# Patient Record
Sex: Male | Born: 1965 | Race: White | Hispanic: No | Smoking: Never smoker
Health system: Southern US, Community
[De-identification: ages and names within clinical notes are randomized; demographics above are authoritative.]

## PROBLEM LIST (undated history)

## (undated) HISTORY — PX: BACK SURGERY: SHX140

---

## 1997-12-08 ENCOUNTER — Emergency Department (HOSPITAL_COMMUNITY): Admission: EM | Admit: 1997-12-08 | Discharge: 1997-12-08 | Payer: Self-pay

## 1997-12-10 ENCOUNTER — Emergency Department (HOSPITAL_COMMUNITY): Admission: EM | Admit: 1997-12-10 | Discharge: 1997-12-10 | Payer: Self-pay

## 2001-09-12 ENCOUNTER — Encounter: Payer: Self-pay | Admitting: Orthopedic Surgery

## 2001-09-13 ENCOUNTER — Inpatient Hospital Stay (HOSPITAL_COMMUNITY): Admission: EM | Admit: 2001-09-13 | Discharge: 2001-09-14 | Payer: Self-pay | Admitting: Orthopedic Surgery

## 2004-01-03 ENCOUNTER — Ambulatory Visit (HOSPITAL_COMMUNITY): Admission: RE | Admit: 2004-01-03 | Discharge: 2004-01-03 | Payer: Self-pay | Admitting: Gastroenterology

## 2006-09-01 ENCOUNTER — Encounter: Payer: Self-pay | Admitting: Emergency Medicine

## 2006-09-01 ENCOUNTER — Ambulatory Visit (HOSPITAL_COMMUNITY): Admission: RE | Admit: 2006-09-01 | Discharge: 2006-09-01 | Payer: Self-pay | Admitting: Family Medicine

## 2006-09-06 ENCOUNTER — Ambulatory Visit (HOSPITAL_COMMUNITY): Admission: RE | Admit: 2006-09-06 | Discharge: 2006-09-06 | Payer: Self-pay | Admitting: Urology

## 2010-06-17 NOTE — Op Note (Signed)
NAMEGERAD, CORNELIO             ACCOUNT NO.:  [id number]   MEDICAL RECORD NO.:  [id number]          PATIENT TYPE:  EMS   LOCATION:  MAJO                         FACILITY:  [facility]   PHYSICIAN:  Martina Sinner, MD DATE OF BIRTH:  1965-09-05   DATE OF PROCEDURE:  09/01/2006  DATE OF DISCHARGE:                               OPERATIVE REPORT   PREOPERATIVE DIAGNOSIS:  Right ureteral stone with renal colic.   POSTOPERATIVE DIAGNOSIS:  Right ureteral stone with renal colic.   SURGERY:  Cystoscopy, right retrograde urethrogram, insertion of right  ureteral stent.   Mr. Holman Bonsignore had a renal colic not settling down in the emergency  room.  We decided to perform a cystoscopy, retrograde urethrogram and  stent.   The patient is prepped and draped in the usual fashion.  He was given IV  ciprofloxacin.  He had a little bit of a faint rash on his chest so we  stopped ciprofloxacin and gave him 80 mg IV gentamicin.   A 21-French scope was used for the examination.  The penile bulbar  membranous and prostatic urethra were normal.  Bladder mucosa was  normal.  The ureteral orifices were normal.  There was no stitch,  foreign body or carcinoma.   I initially passed a sensor guide wire fluoroscopically and  cystoscopically into the high pelvic right ureter.  I then placed a  ureteral catheter over, this removing the sensor wire.  I then did a  retrograde urethrogram.  He had no hydroureter.  He did not have a lot  of hydronephrosis.  In my opinion, you could see a filling defect just  below the right ureteral pelvic junction which is in keeping with the  position of the stone on CT scan.   Under fluoroscopic guidance, a sensor wire was then replaced through the  open-ended catheter up into the upper pole calix fluoroscopically.  Dye  stayed in the renal pelvis, allowing me to easily see its position.   I then placed a double-J stent that was 26 cm in length x 7 mm, passed  the  stent in the operative into the renal pelvis and also curling it up  into the bladder.  The wire was removed allowing the stent to lay in  nicely.  We did a hard copy x-rays for documentation.     There was minimal bleeding.  Bladder was emptied at the end of the  case.  No catheter was used.   The patient will be sent home with Vicodin.  He was given gentamicin and  some ciprofloxacin.  I am not going to give him any antibiotics.  I will  see him on Friday and get a KUB.  He likely is a good candidate for  lithotripsy.           ______________________________  Martina Sinner, MD  Electronically Signed    SAM/MEDQ  D:  09/01/2006  T:  09/02/2006  Job:  [id number]

## 2010-06-17 NOTE — Op Note (Signed)
NAMEJAMESEN, STAHNKE             ACCOUNT NO.:  [id number]   MEDICAL RECORD NO.:  [id number]          PATIENT TYPE:  EMS   LOCATION:  MAJO                         FACILITY:  [facility]   PHYSICIAN:  Martina Sinner, MD DATE OF BIRTH:  31-Oct-1965   DATE OF PROCEDURE:  09/01/2006  DATE OF DISCHARGE:                               OPERATIVE REPORT   PREOPERATIVE DIAGNOSIS:  Right ureteral stone with renal colic.   POSTOPERATIVE DIAGNOSIS:  Right ureteral stone with renal colic.   SURGERY:  Right retrograde urethrogram.   Mr. Lobue had a right retrograde urethrogram under anesthesia.  I  initially placed a sensor wire into the high pelvic ureter and then an  open-ended catheter.  I used approximately 6 mL of contrast and injected  the ureter not under pressure.  There is no extravasation.  There was no  hydroureter and minimal hydronephrosis of the right renal pelvis.  I  felt that I could see a filling defect or stone just below the right  ureteropelvic junction.  Fluoroscopy was also done to check the position  of the stent during and at the end of the case.           ______________________________  Martina Sinner, MD  Electronically Signed     SAM/MEDQ  D:  09/01/2006  T:  09/02/2006  Job:  [id number]

## 2010-06-17 NOTE — Op Note (Signed)
NAMEAXTEN, PASCUCCI             ACCOUNT NO.:  [id number]   MEDICAL RECORD NO.:  [id number]          PATIENT TYPE:  AMB   LOCATION:  DAY                          FACILITY:  [facility]   PHYSICIAN:  Martina Sinner, MD DATE OF BIRTH:  May 04, 1965   DATE OF PROCEDURE:  09/01/2006  DATE OF DISCHARGE:  09/01/2006                               OPERATIVE REPORT   PREOPERATIVE DIAGNOSIS:  Right ureteral stone.   POSTOPERATIVE DIAGNOSIS:  Right ureteral stone.   SURGERY:  Cystoscopy, right retrograde ureterogram, and insertion of  right ureteral stent.   Mr. [place] has right renal colic.  For symptomatic relief, he  underwent the above procedure.   The patient was prepped and draped in the usual fashion.  A 21 [place]  scope was used for the examination.  The penile, bulbar, membranous, and  prostatic urethra were normal.  The bladder mucosa and trigone were  normal.  There was no stitch, foreign body, or carcinoma.  There was no  edema of the right ureteral orifice.   Using a glidewire, I was able to easily pass the open ended ureteral  catheter into the distal right ureter.  I did a right retrograde  ureterogram.  He had a filling defect near the right ureteropelvic  junction.  Fluoroscopically, I placed a wire up and beyond the stone in  the right renal pelvis.  He had a small right renal pelvis.  I then  passed a 26 cm x 7 French stent into the right renal pelvis.  Fluoroscopically, it was in good position.  Cystoscopically, the stent  was in good position.  The bladder was emptied and the patient was taken  to the recovery room.   RIGHT RETROGRADE URETHROGRAM:   DIAGNOSIS:  Right ureteral stone.   Under anesthesia, Mr. Skare underwent a right retrograde ureterogram  using a 7-French open end ureteral catheter cystoscopically and  fluoroscopically placed into the right distal ureter.  I injected  approximately 8 mL of contrast.  He had a filling defect of the right  ureteropelvic junction.  He had a normal ureter in caliber.  He did not  have marked hydronephrosis.  He had a small right renal pelvis.  I was  able to easily pass the stent fluoroscopically beyond the stone.  Fluoroscopically, the stent was in good position in the bladder and  renal pelvis.           ______________________________  Martina Sinner, MD  Electronically Signed     SAM/MEDQ  D:  09/28/2006  T:  09/28/2006  Job:  [id number]

## 2010-06-20 NOTE — Op Note (Signed)
Bruce Fitzgerald, RESSLER             ACCOUNT NO.:  [id number]   MEDICAL RECORD NO.:  [id number]          PATIENT TYPE:  AMB   LOCATION:  ENDO                         FACILITY:  [facility]   PHYSICIAN:  Graylin Shiver, M.D.   DATE OF BIRTH:  [birth year]   DATE OF PROCEDURE:  01/03/2004  DATE OF DISCHARGE:                                 OPERATIVE REPORT   PROCEDURE:  Colonoscopy.   INDICATIONS:  The patient complains of frequent bowel movements and states  that after he has a bowel movement, he has the sensation that he has to go  again but cannot.  It was felt this could be secondary to rectal spasm.  He  was tried on Bentyl 20 mg q.i.d., but this has not helped.  Colonoscopy is  being done to look for an abnormality in the colon.   Informed consent was obtained after explanation of the risks of bleeding,  infection, and perforation.   PREMEDICATION:  Fentanyl 60 mcg IV, Versed 8 mg IV.   PROCEDURE:  With the patient in the left lateral decubitus position, a  rectal exam was performed.  No masses were felt, no external lesions were  seen.  The Olympus colonoscope was inserted into the rectum and advanced  around the colon to the cecum.  Cecal landmarks were identified.  The cecum  and ascending colon were normal, the descending colon normal, the transverse  colon normal, the descending colon, sigmoid, and rectum were normal.  The  scope was retroflexed in the rectum.  No abnormalities were seen.  He  tolerated the procedure well without complications.   IMPRESSION:  Normal colonoscopy to the cecum.   I will give the patient a therapeutic trial of Robinul Forte to see if this  helps instead of Bentyl.       SFG/MEDQ  D:  01/03/2004  T:  01/03/2004  Job:  [id number]   cc:   Otilio Connors. Gerri Spore, M.D.  [address]  [place]  [place] [address]  Fax: [phone]

## 2010-06-20 NOTE — Op Note (Signed)
TNAMEKINGSTYN, DERUITER                      ACCOUNT NO.:  [id number]   MEDICAL RECORD NO.:  [id number]                   PATIENT TYPE:  AMB   LOCATION:  DAY                                  FACILITY:  [facility]   PHYSICIAN:  Georges Lynch. Darrelyn Hillock, M.D.             DATE OF BIRTH:  06/25/1965   DATE OF PROCEDURE:  09/12/2001  DATE OF DISCHARGE:                                 OPERATIVE REPORT   SURGEON:  Georges Lynch. Darrelyn Hillock, M.D.   ASSISTANT:  Illene Labrador. [place], M.D.   Fax copy to [employer]. Ethelene Hal, M.D. at [phone].   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   POSTOPERATIVE DIAGNOSIS:  A large, extruded herniated disk at L5-S1 central  and to the left.  Note, he has strictly left lower extremity pain and back  pain.  No right lower extremity pain.   OPERATION:  Hemilaminotomy and microdiskectomy at L5-S1 on the left.   DESCRIPTION OF PROCEDURE:  Under general anesthesia, a routine orthopedic  prep and draping of the lower back was carried out.  The patient was on a  spinal frame.  Two needles were placed in the back for localization  purposes, and x-ray was taken.  Once we had verified the L5-S1 interspace,  an incision was made over the L5-S1 interspace.  Bleeders identified and  cauterized.  Following this, another x-ray was taken to verify our exact  position.  We were definitely at L5-S1.  At this point, a hemilaminotomy was  carried out in the usual fashion.  I then removed the ligamentum flavum with  great care taken not to injure underlying dura.  The dura was protected with  cottonoids.  I gently retracted the dura from the lateral recess region,  cleaned out the lateral recess, and then went down and cauterized the  lateral recess veins with a bipolar.  I then identified a disk.  He had a  large herniated disk at L5-S1.  Cruciate incision was made in the posterior  longitudinal ligament, and disk literally exploded out  through the defect we  created.  I then went with a nerve hook and then the Epstein curettes and  then with the pituitary rongeurs and removed multiple large pieces of disk  material.  We nicely decompressed the S1 nerve root.  We were able to easily  follow the hockey-stick above and below the root as it exited the foramen.  We went in and made multiple passes into the disk space, and we went  subligamentous as well, and there were no otter free fragments noted.  The  dura and nerve root now was quite nicely decompressed.  We thoroughly  irrigated out the area again and then loosely applied some  thrombin-soaked  Gelfoam over the dura.  I then closed the wound in layers in the usual  fashion.  I left the superior part  of the deep part of the wound slightly  open for drainage purposes.  The remaining part of the subcu and skin was  closed in the usual fashion.  Sterile Neosporin dressings applied.  He had 1  g of IV Ancef prior to surgery.                                                Ronald A. Darrelyn Hillock, M.D.   RAG/MEDQ  D:  09/12/2001  T:  09/14/2001  Job:  [id number]   cc:   Caralyn Guile. Ethelene Hal, M.D.

## 2010-11-17 LAB — POCT URINE HEMOGLOBIN
Hgb urine dipstick: POSITIVE — AB
Operator id: 279831

## 2010-11-17 LAB — URINALYSIS, ROUTINE W REFLEX MICROSCOPIC
Bilirubin Urine: NEGATIVE
Glucose, UA: NEGATIVE
Ketones, ur: 15 — AB
Leukocytes, UA: NEGATIVE
Nitrite: NEGATIVE
Protein, ur: 30 — AB
Specific Gravity, Urine: 1.03
Urobilinogen, UA: 0.2
pH: 5.5

## 2010-11-17 LAB — I-STAT 8, (EC8 V) (CONVERTED LAB)
Acid-base deficit: 1
BUN: 19
Bicarbonate: 24.1 — ABNORMAL HIGH
Chloride: 106
Glucose, Bld: 125 — ABNORMAL HIGH
HCT: 45
Hemoglobin: 15.3
Operator id: 279831
Potassium: 4
Sodium: 138
TCO2: 25
pCO2, Ven: 42.7 — ABNORMAL LOW
pH, Ven: 7.359 — ABNORMAL HIGH

## 2010-11-17 LAB — URINE MICROSCOPIC-ADD ON

## 2010-11-17 LAB — POCT I-STAT CREATININE
Creatinine, Ser: 1.2
Operator id: 279831

## 2014-04-06 ENCOUNTER — Emergency Department (HOSPITAL_COMMUNITY)
Admission: EM | Admit: 2014-04-06 | Discharge: 2014-04-06 | Disposition: A | Discharge status: Home / Self Care | Payer: No Typology Code available for payment source | Attending: Emergency Medicine | Admitting: Emergency Medicine

## 2014-04-06 ENCOUNTER — Encounter (HOSPITAL_COMMUNITY): Payer: Self-pay | Admitting: Neurology

## 2014-04-06 ENCOUNTER — Emergency Department (HOSPITAL_COMMUNITY): Payer: No Typology Code available for payment source

## 2014-04-06 DIAGNOSIS — R29898 Other symptoms and signs involving the musculoskeletal system: Secondary | ICD-10-CM

## 2014-04-06 DIAGNOSIS — S3992XA Unspecified injury of lower back, initial encounter: Secondary | ICD-10-CM | POA: Insufficient documentation

## 2014-04-06 DIAGNOSIS — Y998 Other external cause status: Secondary | ICD-10-CM | POA: Diagnosis not present

## 2014-04-06 DIAGNOSIS — M549 Dorsalgia, unspecified: Secondary | ICD-10-CM

## 2014-04-06 DIAGNOSIS — Y92410 Unspecified street and highway as the place of occurrence of the external cause: Secondary | ICD-10-CM | POA: Diagnosis not present

## 2014-04-06 DIAGNOSIS — M542 Cervicalgia: Secondary | ICD-10-CM

## 2014-04-06 DIAGNOSIS — S299XXA Unspecified injury of thorax, initial encounter: Secondary | ICD-10-CM | POA: Insufficient documentation

## 2014-04-06 DIAGNOSIS — Y9389 Activity, other specified: Secondary | ICD-10-CM | POA: Diagnosis not present

## 2014-04-06 DIAGNOSIS — Z9889 Other specified postprocedural states: Secondary | ICD-10-CM | POA: Insufficient documentation

## 2014-04-06 DIAGNOSIS — M545 Low back pain: Secondary | ICD-10-CM

## 2014-04-06 DIAGNOSIS — S199XXA Unspecified injury of neck, initial encounter: Secondary | ICD-10-CM | POA: Diagnosis present

## 2014-04-06 DIAGNOSIS — S79912A Unspecified injury of left hip, initial encounter: Secondary | ICD-10-CM | POA: Diagnosis not present

## 2014-04-06 DIAGNOSIS — M25559 Pain in unspecified hip: Secondary | ICD-10-CM

## 2014-04-06 MED ORDER — METHOCARBAMOL 500 MG PO TABS: 500.0000 mg | ORAL_TABLET | Freq: Two times a day (BID) | ORAL | Status: AC

## 2014-04-06 MED ORDER — KETOROLAC TROMETHAMINE 60 MG/2ML IM SOLN
60.0000 mg | Freq: Once | INTRAMUSCULAR | Status: AC
  Administered 2014-04-06: 60 mg via INTRAMUSCULAR
  Filled 2014-04-06: qty 2
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
## 2014-04-06 NOTE — ED Notes (Signed)
Patient alert and oriented at discharge.  Patient taken to the waiting room via wheelchair by EMT.
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
## 2018-05-11 ENCOUNTER — Other Ambulatory Visit: Payer: Self-pay

## 2018-05-11 ENCOUNTER — Ambulatory Visit: Payer: Self-pay | Admitting: Physician Assistant

## 2018-05-11 VITALS — BP 150/100 | HR 85 | Temp 98.5°F | Resp 20 | Wt 264.0 lb

## 2018-05-11 DIAGNOSIS — K047 Periapical abscess without sinus: Secondary | ICD-10-CM

## 2018-05-11 DIAGNOSIS — R030 Elevated blood-pressure reading, without diagnosis of hypertension: Secondary | ICD-10-CM

## 2018-05-11 MED ORDER — AMOXICILLIN-POT CLAVULANATE 875-125 MG PO TABS: 1.0000 | ORAL_TABLET | Freq: Two times a day (BID) | ORAL | 0 refills | Status: AC

## 2018-05-11 NOTE — Progress Notes (Signed)
MRN: [id number] DOB: 11-29-1965  Subjective:   Bruce Fitzgerald is a 53 y.o. male presenting for chief complaint of tooth pain x 4 days. Has infected tooth on right bottom gum that has a crown.Has been having issues with this on and off for the past few months. Contacted his dentist and they told him he needs antibiotics before they can evaluate him. Over past 4 days, has had worsening pain and swelling surrounding the tooth.  Denies fever, purulent drainage, sinus pain, ear pain, neck pain, nausea, vomiting, trouble swallowing, shortness of breath, and difficulty breathing.  No recent travel.  No sick contact exposure.  Denies history of diabetes, hypertension, heart disease, and other immunocompromising conditions.  Denies smoking.  Denies any other aggravating or relieving factors, no other questions or concerns.  Review of Systems  Constitutional: Negative for chills.  HENT: Negative for congestion.   Eyes: Negative for blurred vision, double vision and photophobia.  Respiratory: Negative for cough and shortness of breath.   Cardiovascular: Negative for chest pain, palpitations and leg swelling.  Gastrointestinal: Negative for abdominal pain.  Genitourinary: Negative for hematuria.  Musculoskeletal: Negative for myalgias.  Skin: Negative for rash.  Neurological: Negative for dizziness and headaches.    Molly MaduroRobert has a current medication list which includes the following prescription(s): acetaminophen, naproxen sodium, ibuprofen, and methocarbamol. Also has No Known Allergies.  Molly MaduroRobert  has no past medical history on file. Also  has a past surgical history that includes Back surgery.   Objective:   Vitals: BP (!) 150/100 (BP Location: Right Arm, Patient Position: Sitting, Cuff Size: Large)   Pulse 85   Temp 98.5 F (36.9 C) (Oral)   Resp 20   Wt 264 lb (119.7 kg)   SpO2 98%   Physical Exam Vitals signs reviewed.  Constitutional:      General: He is not in acute distress.  Appearance: He is well-developed. He is not toxic-appearing.  HENT:     Head: Normocephalic and atraumatic.     Jaw: No trismus.     Salivary Glands: Right salivary gland is not diffusely enlarged or tender. Left salivary gland is not diffusely enlarged or tender.     Right Ear: Tympanic membrane, ear canal and external ear normal.     Left Ear: Tympanic membrane, ear canal and external ear normal.     Nose: Nose normal.     Right Sinus: No maxillary sinus tenderness or frontal sinus tenderness.     Left Sinus: No maxillary sinus tenderness or frontal sinus tenderness.     Mouth/Throat:     Mouth: No angioedema.     Dentition: Abnormal dentition. Dental tenderness and gingival swelling present.     Pharynx: Oropharynx is clear. Uvula midline. No pharyngeal swelling or posterior oropharyngeal erythema.     Tonsils: No tonsillar exudate.      Comments: No swelling of submandibular and submental region.  Eyes:     Conjunctiva/sclera: Conjunctivae normal.     Pupils: Pupils are equal, round, and reactive to light.  Neck:     Musculoskeletal: Full passive range of motion without pain and normal range of motion.  Cardiovascular:     Rate and Rhythm: Normal rate and regular rhythm.     Heart sounds: Normal heart sounds.  Pulmonary:     Effort: Pulmonary effort is normal.     Breath sounds: Normal breath sounds. No decreased breath sounds, wheezing, rhonchi or rales.  Musculoskeletal:     Right lower leg: He  exhibits no swelling.     Left lower leg: He exhibits no swelling.  Skin:    General: Skin is warm and dry.  Neurological:     Mental Status: He is alert and oriented to person, place, and time.     No results found for this or any previous visit (from the past 24 hour(s)).  BP Readings from Last 3 Encounters:  05/11/18 (!) 150/100  04/06/14 154/91    Assessment and Plan :  1. Dental infection Patient is overall well-appearing, no acute distress.  He is afebrile.  There is  obvious local induration surrounding affected tooth.  No fluctuance palpated. No concern for deeper infection at this time. No trismus, submental/submanibular swelling, trouble swallowing, neck pain, or sinus pain. Would suggest starting antibiotic today and contacting dentist as soon as he leaves this office to schedule dental visit.  Can continue over-the-counter analgesics as prescribed for pain. Exam findings, diagnosis etiology, medication use, indications, side effects, risks, benefits, and alternatives of the medications and treatment plan prescribed today and reviewed with patient. Follow- Up and discharge instructions provided.vPatient education was provided. Patient verbalized understanding of information provided and agrees with plan of care (POC), all questions answered. No barriers to understanding were identified. Red flags discussed in detail and provided on AVS. The patient is to f/u with dentist within the next 48 hours and to seek the care of the closest emergency department if condition worsens with the above plan before seeing dentist.  - amoxicillin-clavulanate (AUGMENTIN) 875-125 MG tablet; Take 1 tablet by mouth 2 (two) times daily.  Dispense: 20 tablet; Refill: 0  2. Elevated blood pressure reading Elevated in office. No dx of HTN. Could be related to pain. One prior reading in the Epic system shows elevated blood pressure. Pt does not have PCP. He is otherwise asx. Would rec checking bp outside the office. If consistently >140/90, would rec contacting a PCP office to schedule visit for evaluation. Given him a list of local primary care offices. Given strict ED precautions. Pt voices understanding.    Benjiman Core, Cordelia Poche  [facility] 05/11/2018 10:32 AM

## 2018-05-11 NOTE — Patient Instructions (Signed)
Dental Abscess   Start antibiotics. Contact your dentist today and let him know that you received antibiotics and that you do have swelling around the affected tooth. You should be seen by a dentist within the next 48 hours. If while awaiting this, you start to have worsening pain, swelling, new fever, neck pain, sinus pain, ear pain, nausea, vomiting, sweating, or other concerning symptoms, seek care immediately at ED.  In terms of elevated blood pressure, this could be due to pain.  I would like you to check your blood pressure at least a couple times over the next week outside of the office and document these values. It is best if you check the blood pressure at different times in the day. Your goal is <140/90. If your values are consistently above this goal, please contact primary care doctor for evaluation. If you start to have chest pain, blurred vision, shortness of breath, severe headache, lower leg swelling, or nausea/vomiting please seek care immediately at ED.     A dental abscess is an area of pus in or around a tooth. It comes from an infection. It can cause pain and other symptoms. Treatment will help with symptoms and prevent the infection from spreading. Follow these instructions at home: Medicines  Take over-the-counter and prescription medicines only as told by your dentist.  If you were prescribed an antibiotic medicine, take it as told by your dentist. Do not stop taking it even if you start to feel better.  If you were prescribed a gel that has numbing medicine in it, use it exactly as told.  Do not drive or use heavy machinery (like a Surveyor, mininglawn mower) while taking prescription pain medicine. General instructions  Rinse out your mouth often with salt water. ? To make salt water, dissolve -1 tsp of salt in 1 cup of warm water.  Eat a soft diet while your mouth is healing.  Drink enough fluid to keep your urine pale yellow.  Do not apply heat to the outside of your mouth.   Do not use any products that contain nicotine or tobacco. These include cigarettes and e-cigarettes. If you need help quitting, ask your doctor.  Keep all follow-up visits as told by your dentist. This is important. Prevent an abscess  Brush your teeth every morning and every night. Use fluoride toothpaste.  Floss your teeth each day.  Get dental cleanings as often as told by your dentist.  Think about getting dental sealant put on teeth that have deep holes (decay).  Drink water that has fluoride in it. ? Most tap water has fluoride. ? Check the label on bottled water to see if it has fluoride in it.  Drink water instead of sugary drinks.  Eat healthy meals and snacks.  Wear a mouth guard or face shield when you play sports. Contact a doctor if:  Your pain is worse, and medicine does not help. Get help right away if:  You have a fever or chills.  Your symptoms suddenly get worse.  You have a very bad headache.  You have problems breathing or swallowing.  You have trouble opening your mouth.  You have swelling in your neck or close to your eye. Summary  A dental abscess is an area of pus in or around a tooth. It is caused by an infection.  Treatment will help with symptoms and prevent the infection from spreading.  Take over-the-counter and prescription medicines only as told by your dentist.  To prevent an  abscess, take good care of your teeth. Brush your teeth every morning and night. Use floss every day.  Get dental cleanings as often as told by your dentist. This information is not intended to replace advice given to you by your health care provider. Make sure you discuss any questions you have with your health care provider. Document Released: 06/05/2014 Document Revised: 09/21/2016 Document Reviewed: 09/21/2016 Elsevier Interactive Patient Education  2019 [employer].

## 2020-01-19 ENCOUNTER — Ambulatory Visit: Payer: Self-pay | Attending: Internal Medicine

## 2020-01-19 DIAGNOSIS — Z23 Encounter for immunization: Secondary | ICD-10-CM

## 2020-01-19 NOTE — Progress Notes (Signed)
   Covid-[facility] Vaccination [facility]  Name:  HENRY UTSEY    MRN: [id number] DOB: Jul 21, 1965  01/19/2020  Mr. Yamashiro was observed post Covid-19 immunization for 15 minutes without incident. He was provided with Vaccine Information Sheet and instruction to access the V-Safe system.   Mr. Caniglia was instructed to call 911 with any severe reactions post vaccine: Marland Kitchen Difficulty breathing  . Swelling of face and throat  . A fast heartbeat  . A bad rash all over body  . Dizziness and weakness   Immunizations Administered    Name Date Dose VIS Date Route   Pfizer COVID-19 Vaccine 01/19/2020  5:28 PM 0.3 mL 11/22/2019 Intramuscular   Manufacturer: [employer], [employer]   Lot: [id number]   NDC: [address]-5
